# Patient Record
Sex: Female | Born: 2000 | Race: Black or African American | Hispanic: No | Marital: Single | Smoking: Never smoker
Health system: Southern US, Community
[De-identification: ages and names within clinical notes are randomized; demographics above are authoritative.]

---
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
## 2019-02-28 IMAGING — US US ABDOMEN LIMITED
1 series · 14 of 25 positions shown · non-contrast
Comparison: None.

CLINICAL DATA: Right upper quadrant pain

EXAM:
ULTRASOUND ABDOMEN LIMITED RIGHT UPPER QUADRANT

[Series 1: us abdomen limited · 0.16mm/px · 14 of 53 slices shown]
[im 1/53]
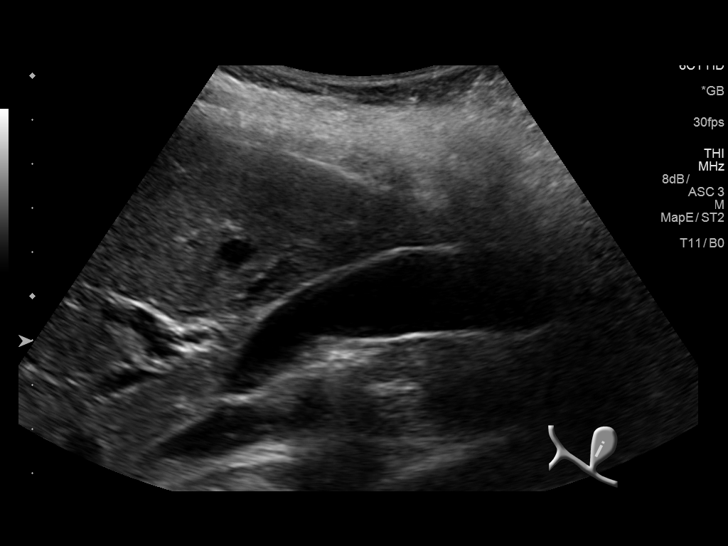
[im 5/53]
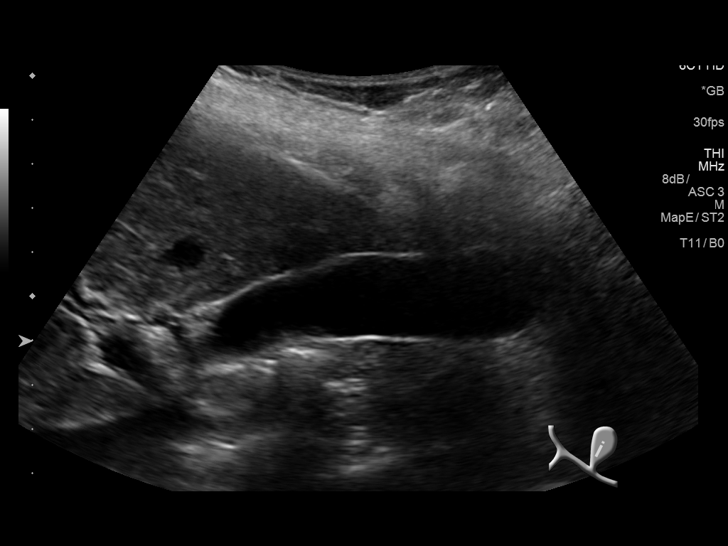
[im 9/53]
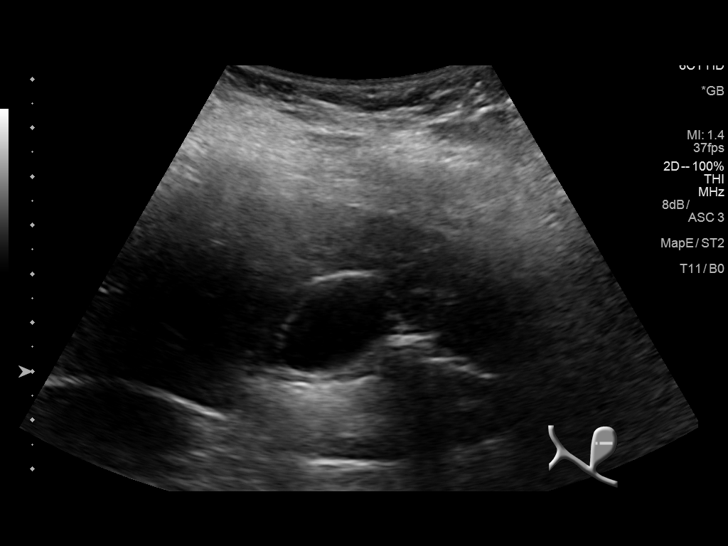
[im 14/53]
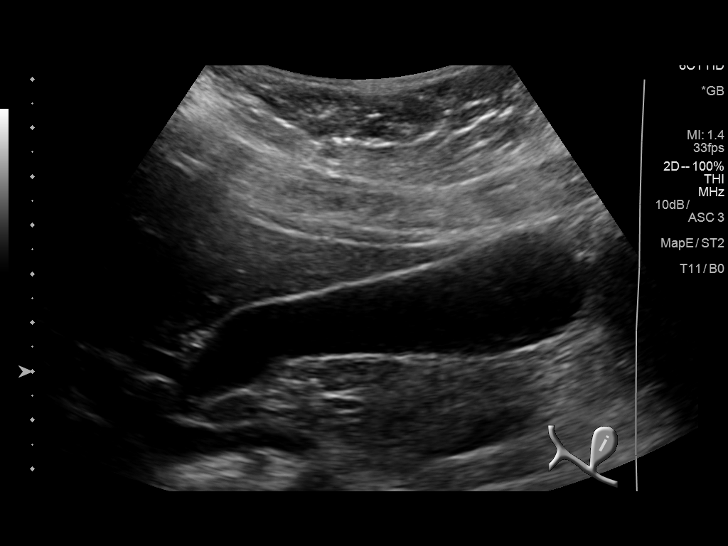
[im 18/53]
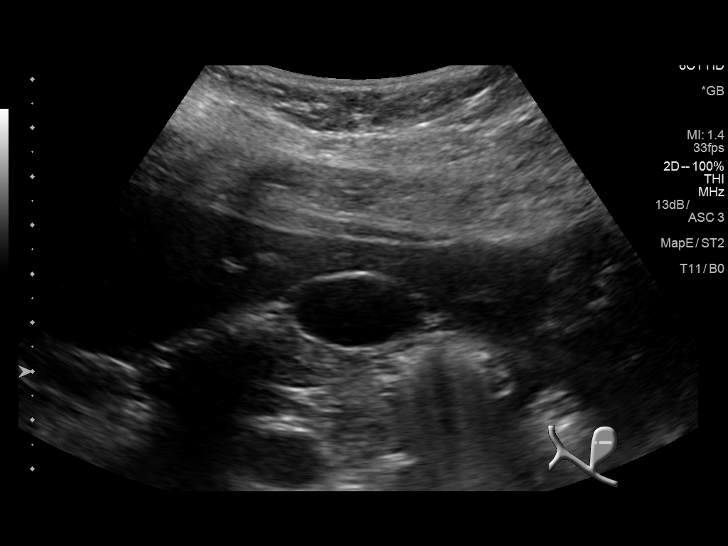
[im 20/53]
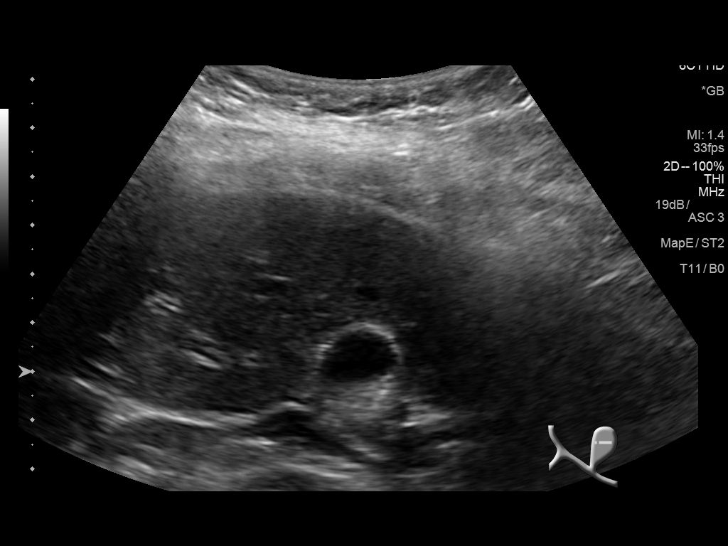
[im 24/53]
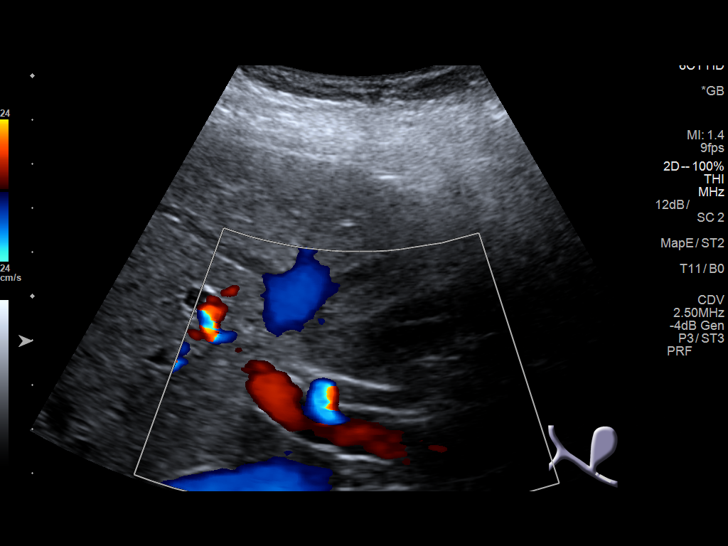
[im 29/53]
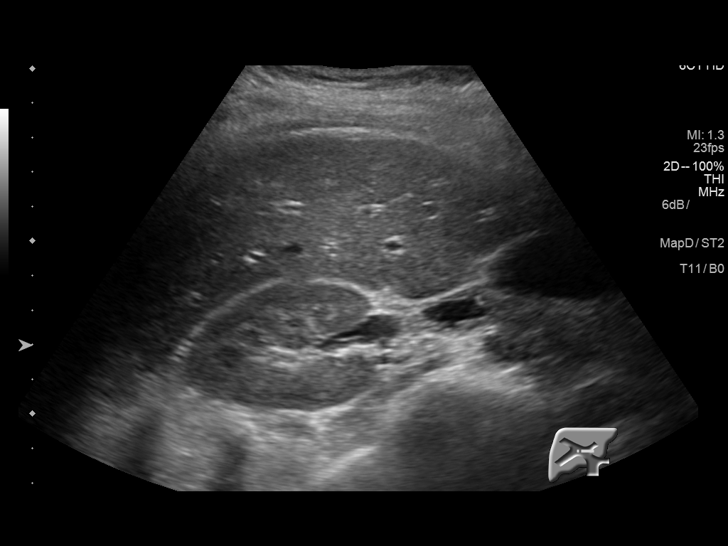
[im 33/53]
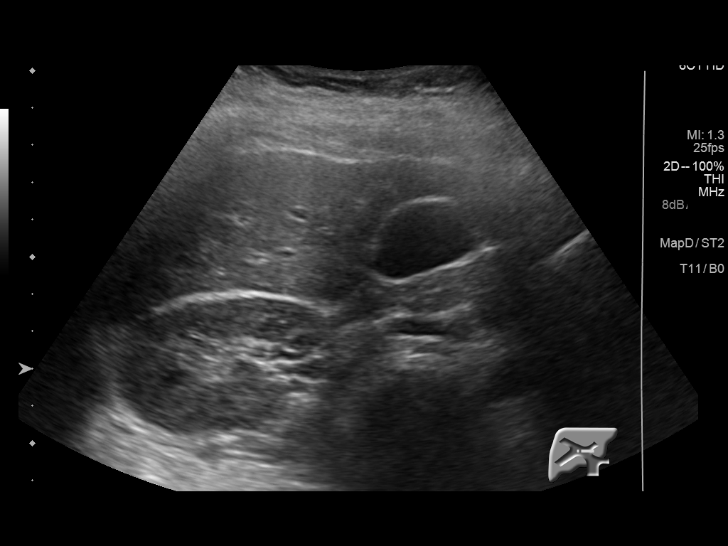
[im 35/53]
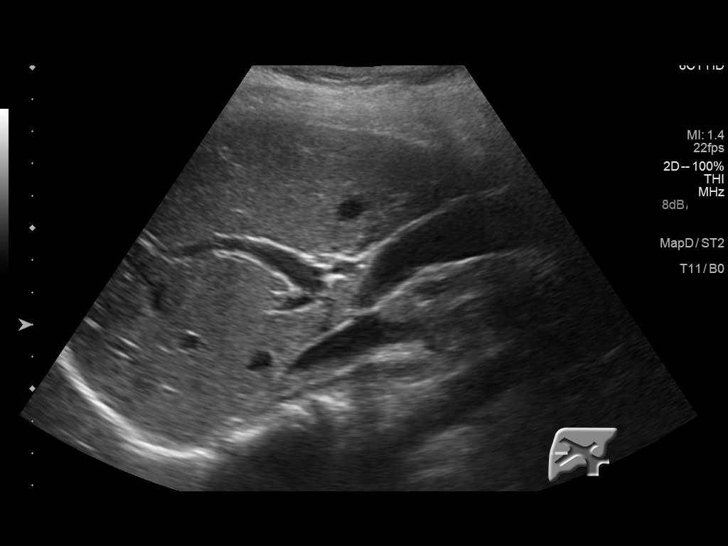
[im 40/53]
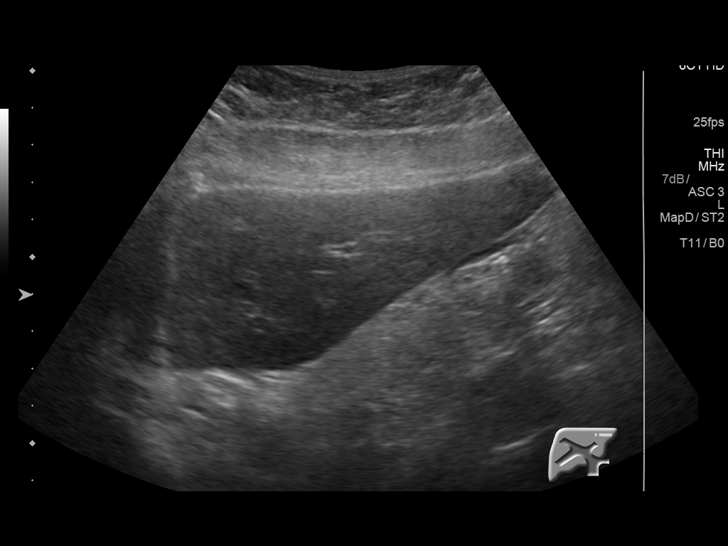
[im 44/53]
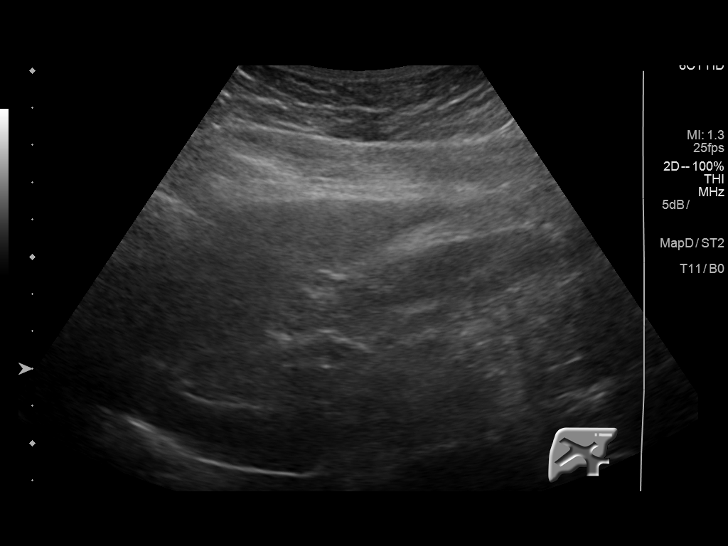
[im 48/53]
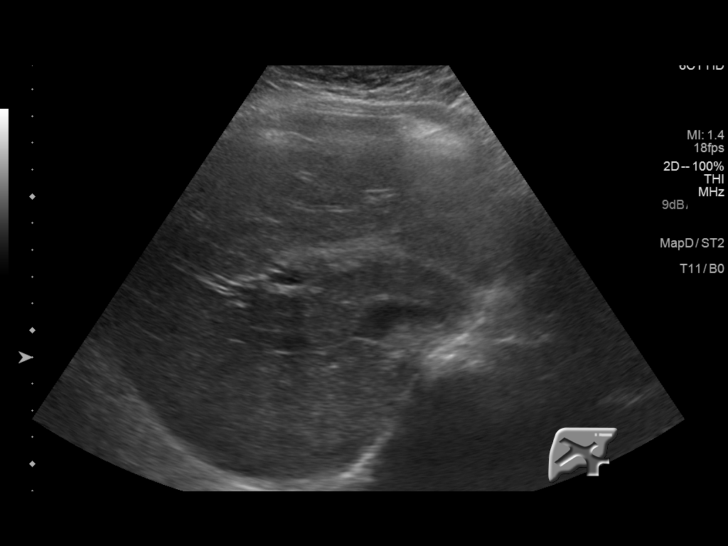
[im 53/53]
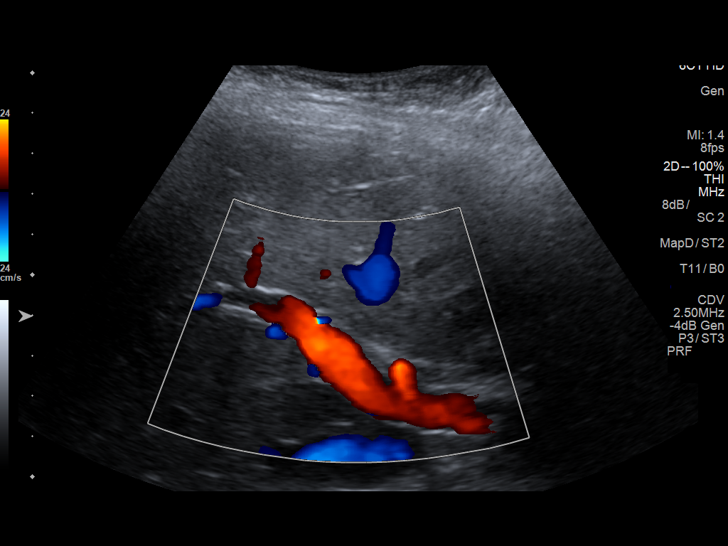

[14 of 25 positions shown; findings below may reference images not displayed]

FINDINGS: Gallbladder:

No gallstones or wall thickening visualized. There is no
pericholecystic fluid. No sonographic Murphy sign noted by
sonographer.

Common bile duct:

Diameter: 4 mm. No intrahepatic or extrahepatic biliary duct
dilatation.

Liver:

No focal lesion identified. Within normal limits in parenchymal
echogenicity. Portal vein is patent on color Doppler imaging with
normal direction of blood flow towards the liver.
IMPRESSION: Study within normal limits.

## 2019-03-17 IMAGING — CT CT ABD-PELV W/ CM
3 of 9 series · 11 of 46 positions shown, 17 images · IV contrast (Isovue)
Comparison: None.

CLINICAL DATA: Right upper quadrant pain, abdominal, back, leg, and
right-sided rib cage pain starting a week ago. Elevated D-dimer.

EXAM:
CT ANGIOGRAPHY CHEST
CT ABDOMEN AND PELVIS WITH CONTRAST
TECHNIQUE: Multidetector CT imaging of the chest was performed using the
standard protocol during bolus administration of intravenous
contrast. Multiplanar CT image reconstructions and MIPs were
obtained to evaluate the vascular anatomy. Multidetector CT imaging
of the abdomen and pelvis was performed using the standard protocol
during bolus administration of intravenous contrast.
CONTRAST:  100mL OGNL5F-Y3E IOPAMIDOL (OGNL5F-Y3E) INJECTION 76%

[Series 5: thins · axial · 0.61mm/px · z∈[-232,-147]mm · 4 of 256 slices shown]
[im 29/256  soft-tissue]
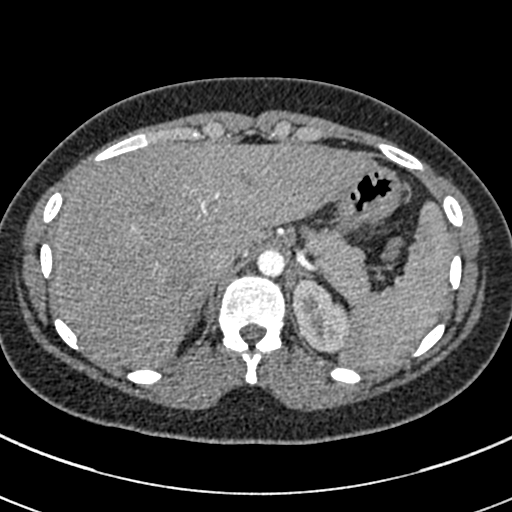
[im 57/256  soft-tissue]
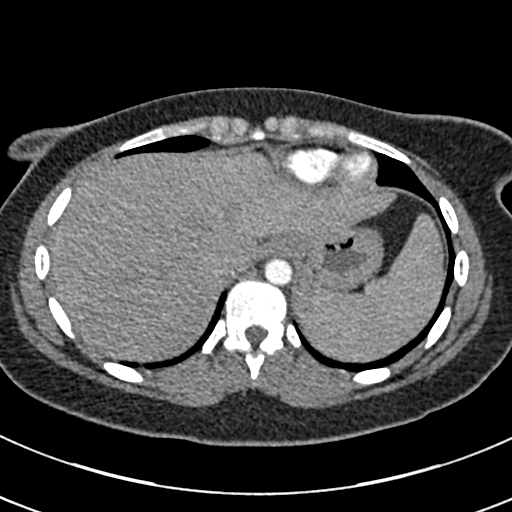
[im 86/256  soft-tissue]
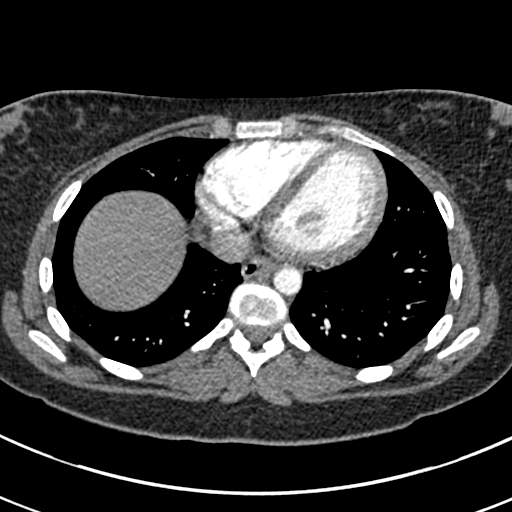
[im 114/256  soft-tissue]
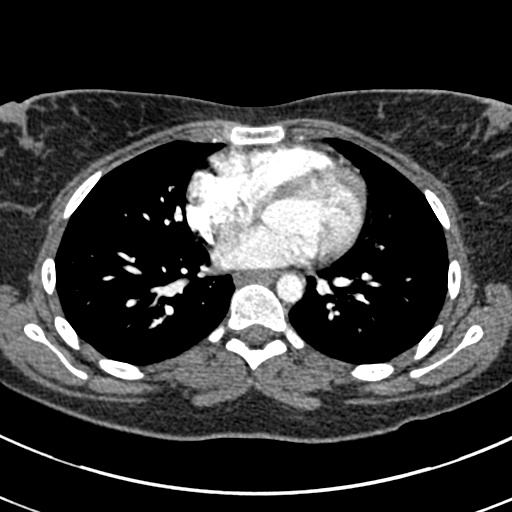

[Series 8: coronal mpr · coronal · 0.55mm/px · 2 of 115 slices shown, 3 images]
[im 39/115  soft-tissue]
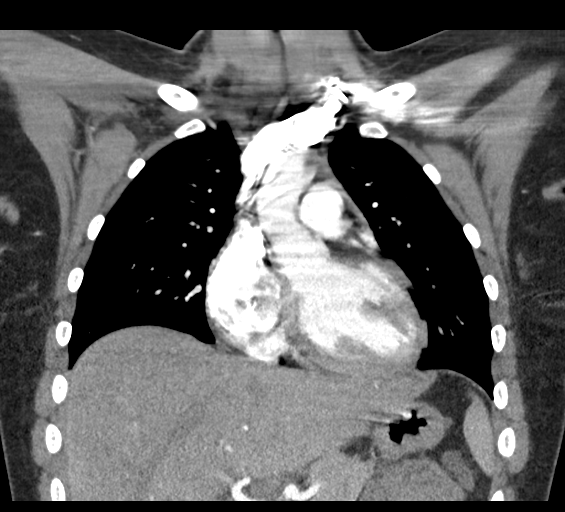
[im 39/115  bone]
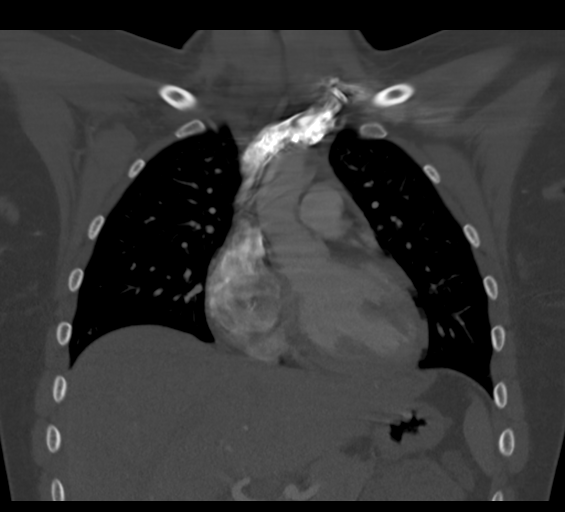
[im 77/115  soft-tissue]
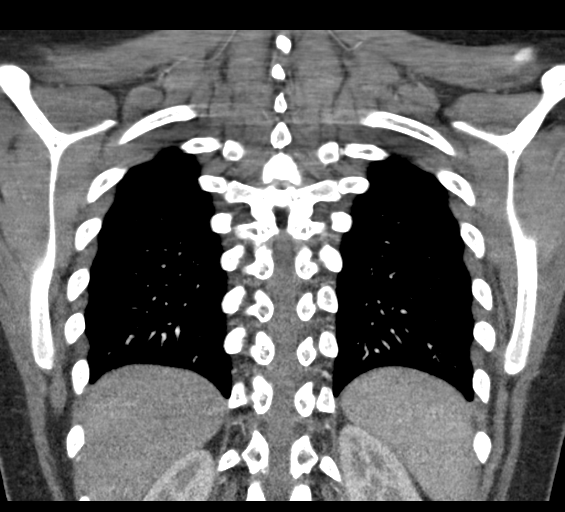

[Series 12: axial st · axial · 0.69mm/px · z∈[-547,-232]mm · 5 of 95 slices shown, 10 images]
[im 16/95  soft-tissue]
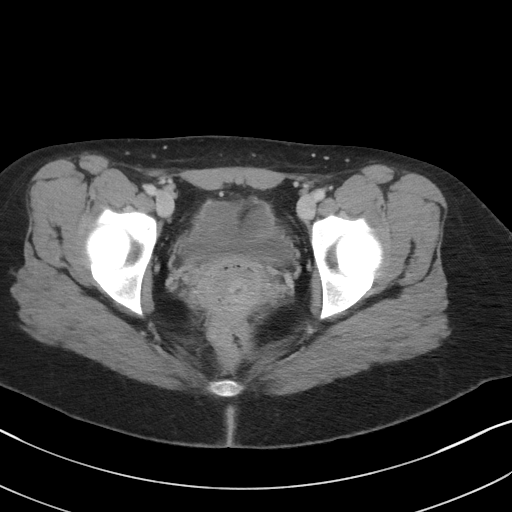
[im 16/95  bone]
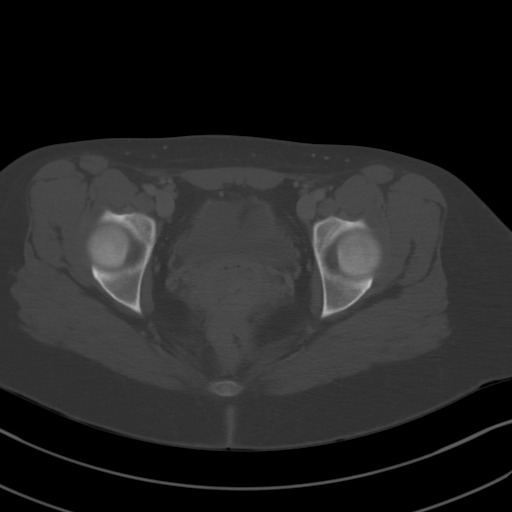
[im 32/95  soft-tissue]
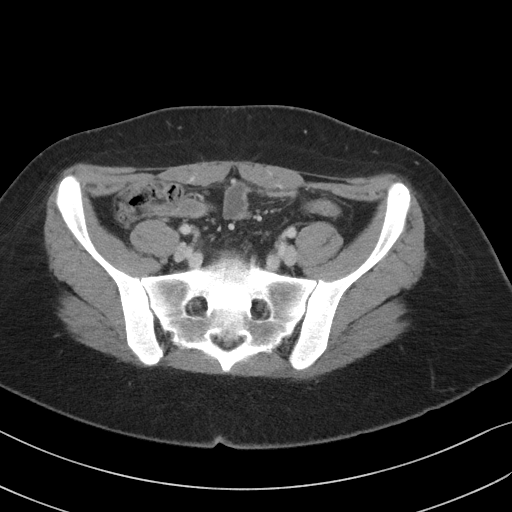
[im 32/95  lung]
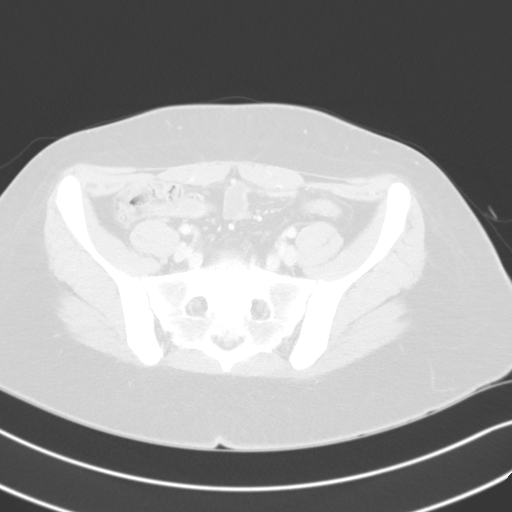
[im 48/95  soft-tissue]
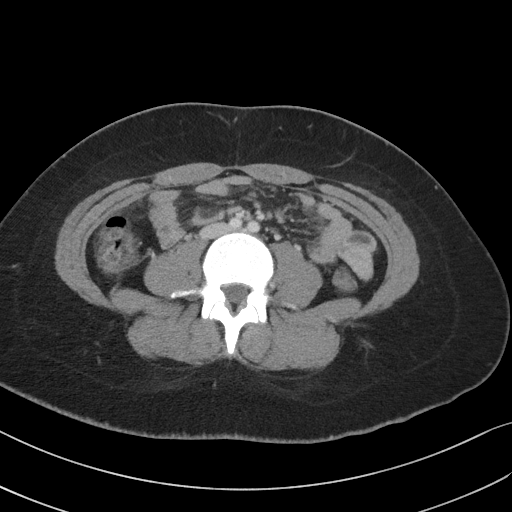
[im 48/95  lung]
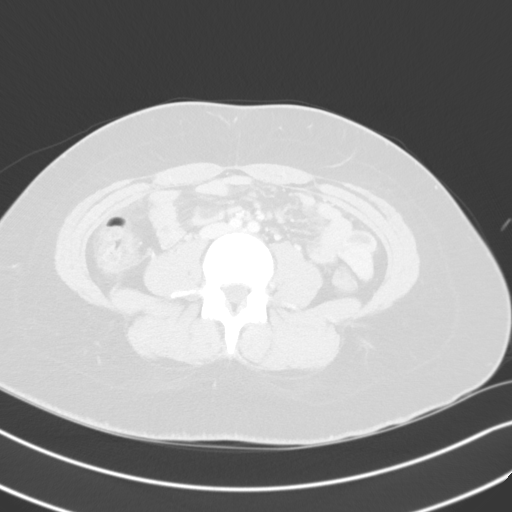
[im 63/95  soft-tissue]
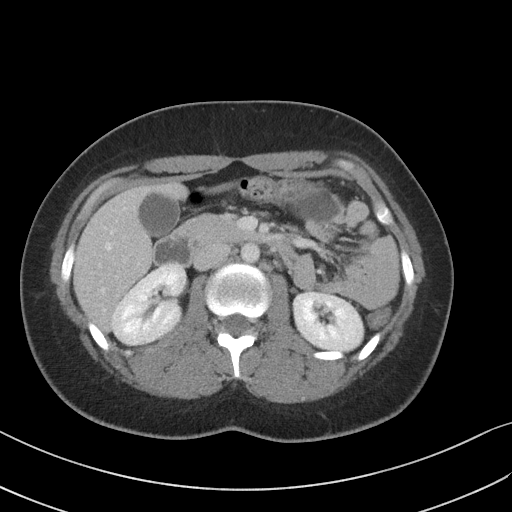
[im 63/95  lung]
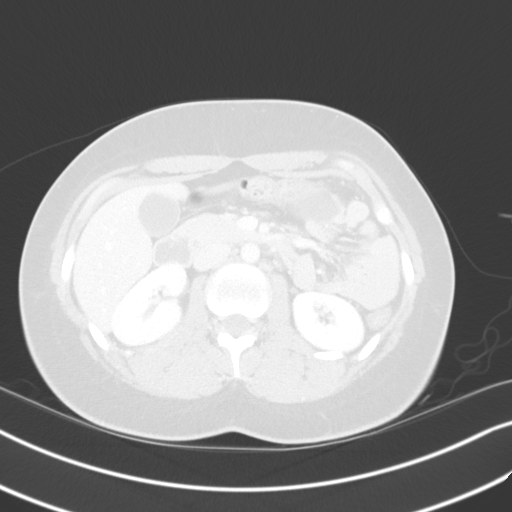
[im 79/95  soft-tissue]
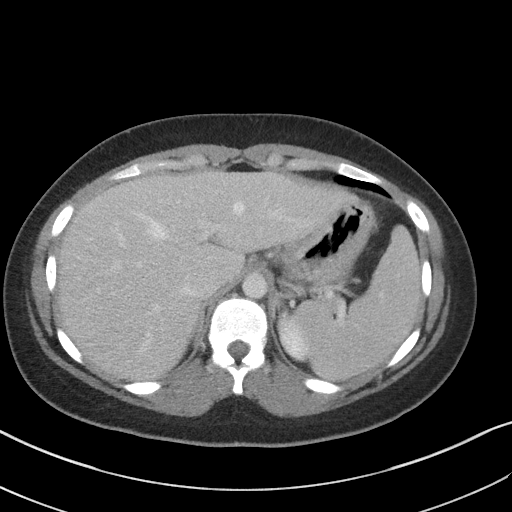
[im 79/95  lung]
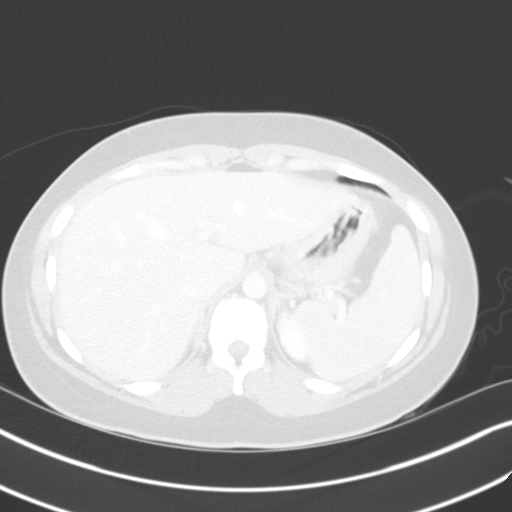

[11 of 46 positions shown; findings below may reference images not displayed]

FINDINGS: CTA CHEST FINDINGS

Cardiovascular: Examination is technically limited due to motion
artifact. There is good opacification of the central and segmental
pulmonary arteries. No focal filling defects. No evidence of
significant pulmonary embolus. Normal heart size. No pericardial
effusion. Normal caliber thoracic aorta. No evidence of aortic
dissection. Great vessel origins are patent.

Mediastinum/Nodes: Esophagus is decompressed. No significant
lymphadenopathy in the chest.

Lungs/Pleura: Evaluation is limited due to motion artifact. Lungs
appear clear and expanded. No focal consolidation. No pleural
effusions. No pneumothorax. Airways are patent.

Musculoskeletal: No chest wall abnormality. No acute or significant
osseous findings.

Review of the MIP images confirms the above findings.

CT ABDOMEN and PELVIS FINDINGS

Hepatobiliary: No focal liver abnormality is seen. No gallstones,
gallbladder wall thickening, or biliary dilatation.

Pancreas: Unremarkable. No pancreatic ductal dilatation or
surrounding inflammatory changes.

Spleen: Normal in size without focal abnormality.

Adrenals/Urinary Tract: No adrenal gland nodules. Renal nephrograms
are symmetrical. No hydronephrosis or hydroureter. Small cyst on the
left kidney. Bladder is unremarkable.

Stomach/Bowel: Stomach, small bowel, and colon are not abnormally
distended. No wall thickening or inflammatory changes appreciated
although under distention limits evaluation of the bowel wall.
Appendix is normal.

Vascular/Lymphatic: No significant vascular findings are present. No
enlarged abdominal or pelvic lymph nodes.

Reproductive: Uterus and ovaries are not significantly enlarged. 2
cysts demonstrated on the left ovary, each measuring about 1.7 cm
diameter. These are likely functional.

Other: No free air or free fluid in the abdomen. Abdominal wall
musculature appears intact. Surgical clips at the umbilicus.

Musculoskeletal: No acute or significant osseous findings.

Review of the MIP images confirms the above findings.
IMPRESSION: 1. No evidence of significant pulmonary embolus. No evidence of
active pulmonary disease.
2. No acute process demonstrated in the abdomen or pelvis. No
evidence of bowel obstruction or inflammation. Appendix is normal.

## 2020-05-23 ENCOUNTER — Emergency Department (HOSPITAL_COMMUNITY)
Admission: EM | Admit: 2020-05-23 | Discharge: 2020-05-23 | Disposition: A | Discharge status: Home / Self Care | Payer: Managed Care, Other (non HMO) | Attending: Emergency Medicine | Admitting: Emergency Medicine

## 2020-05-23 ENCOUNTER — Encounter (HOSPITAL_COMMUNITY): Payer: Self-pay | Admitting: Emergency Medicine

## 2020-05-23 ENCOUNTER — Other Ambulatory Visit: Payer: Self-pay

## 2020-05-23 DIAGNOSIS — Z20822 Contact with and (suspected) exposure to covid-19: Secondary | ICD-10-CM | POA: Diagnosis not present

## 2020-05-23 DIAGNOSIS — J069 Acute upper respiratory infection, unspecified: Secondary | ICD-10-CM | POA: Insufficient documentation

## 2020-05-23 DIAGNOSIS — J029 Acute pharyngitis, unspecified: Secondary | ICD-10-CM | POA: Diagnosis present

## 2020-05-23 LAB — SARS CORONAVIRUS 2 BY RT PCR (HOSPITAL ORDER, PERFORMED IN ~~LOC~~ HOSPITAL LAB): SARS Coronavirus 2: NEGATIVE

## 2020-05-23 LAB — GROUP A STREP BY PCR: Group A Strep by PCR: NOT DETECTED

## 2020-05-23 MED ORDER — ACETAMINOPHEN 325 MG PO TABS
650.0000 mg | ORAL_TABLET | Freq: Once | ORAL | Status: AC
  Administered 2020-05-23: 650 mg via ORAL
  Filled 2020-05-23: qty 2

## 2020-05-23 NOTE — ED Provider Notes (Signed)
[facility] EMERGENCY DEPARTMENT Provider Note   CSN: [id number] Arrival date & time: 05/23/20  1142     History Chief Complaint  Patient presents with   Sore Throat    Sonia Archer is a 19 y.o. female.  19 year old female presents with complaint of chills with sweats, assume she has a fever but has not checked her temperature as well as a sore throat and congestion with body aches. Patient states her symptoms started last night. Patient has not had her COVID-19 vaccine series. No known sick contacts. Has not taken anything for her symptoms. No other complaints or concerns.        History reviewed. No pertinent past medical history.  There are no problems to display for this patient.   History reviewed. No pertinent surgical history.   OB History   No obstetric history on file.     No family history on file.  Social History   Tobacco Use   Smoking status: Never Smoker   Smokeless tobacco: Never Used  Vaping Use   Vaping Use: Never used  Substance Use Topics   Alcohol use: Not Currently   Drug use: Never    Home Medications Prior to Admission medications   Not on File    Allergies    Penicillins  Review of Systems   Review of Systems  Constitutional: Positive for chills and diaphoresis.  HENT: Positive for congestion and sore throat.   Respiratory: Negative for cough and shortness of breath.   Gastrointestinal: Negative for diarrhea.  Musculoskeletal: Positive for arthralgias and myalgias.  Skin: Negative for rash and wound.  Allergic/Immunologic: Negative for immunocompromised state.  Neurological: Negative for weakness.  Hematological: Negative for adenopathy.  Psychiatric/Behavioral: Negative for confusion.  All other systems reviewed and are negative.   Physical Exam Updated Vital Signs BP (!) 151/80 (BP Location: Right Arm)    Pulse (!) 106    Temp 99.6 F (37.6 C) (Oral)    Resp 18    Ht 5\' 7"  (1.702 m)    Wt 80.7 kg    LMP  05/20/2020 (Exact Date)    SpO2 100%    BMI 27.88 kg/m   Physical Exam Vitals and nursing note reviewed.  Constitutional:      General: She is not in acute distress.    Appearance: She is well-developed. She is not diaphoretic.  HENT:     Head: Normocephalic and atraumatic.     Nose: No congestion or rhinorrhea.     Mouth/Throat:     Mouth: No oral lesions.     Pharynx: Oropharynx is clear. Uvula midline. Posterior oropharyngeal erythema present. No pharyngeal swelling, oropharyngeal exudate or uvula swelling.     Tonsils: No tonsillar exudate. 1+ on the right. 1+ on the left.  Cardiovascular:     Rate and Rhythm: Normal rate and regular rhythm.     Heart sounds: Normal heart sounds.  Pulmonary:     Effort: Pulmonary effort is normal.  Skin:    General: Skin is warm and dry.     Findings: No rash.  Neurological:     Mental Status: She is alert and oriented to person, place, and time.  Psychiatric:        Behavior: Behavior normal.     ED Results / Procedures / Treatments   Labs (all labs ordered are listed, but only abnormal results are displayed) Labs Reviewed  GROUP A STREP BY PCR  SARS CORONAVIRUS 2 BY RT PCR (HOSPITAL ORDER, PERFORMED  IN [facility])    EKG None  Radiology No results found.  Procedures Procedures (including critical care time)  Medications Ordered in ED Medications  acetaminophen (TYLENOL) tablet 650 mg (has no administration in time range)    ED Course  I have reviewed the triage vital signs and the nursing notes.  Pertinent labs & imaging results that were available during my care of the patient were reviewed by me and considered in my medical decision making (see chart for details).  Clinical Course as of May 23 1316  Thu May 23, 2020  1316 Sonia Archer was evaluated in Emergency Department on 05/23/2020 for the symptoms described in the history of present illness. She was evaluated in the context of the global COVID-19  pandemic, which necessitated consideration that the patient might be at risk for infection with the SARS-CoV-2 virus that causes COVID-19. Institutional protocols and algorithms that pertain to the evaluation of patients at risk for COVID-19 are in a state of rapid change based on information released by regulatory bodies including the CDC and federal and state organizations. These policies and algorithms were followed during the patient's care in the ED.     [LM]  1274 19 year old female with URI symptoms since last night. Patient's temperature is 99.6 on arrival, she feels warm to the touch. Strep test is negative, suspect Covid infection, will swab for Covid, advised to quarantine accordingly. Will give Tylenol for her elevated temperature today. Medication obtain a thermometer to monitor her temperature, take Tylenol as needed as directed and symptomatic relief as needed.   [LM]  1317 Patient was advised to contact the department for her Covid results will follow for results in her MyChart.   [LM]    Clinical Course User Index [LM] Alden Hipp   MDM Rules/Calculators/A&P                          Final Clinical Impression(s) / ED Diagnoses Final diagnoses:  Viral upper respiratory tract infection    Rx / DC Orders ED Discharge Orders    None       Jeannie Fend, PA-C 05/23/20 1317    Derwood Kaplan, MD 05/24/20 1145

## 2020-05-23 NOTE — Discharge Instructions (Addendum)
Symptoms today are concerning for COVID infection. Recommend obtaining a thermometer to monitor your temperature. Take Tylenol as needed as directed for fevers (temperature of 100.4 or higher).  Quarantine at home pending your results. If your test is negative, continue quarantine until you are fever free for 72 hours without taking Tylenol or other fever reducer. IF your COVID test is positive, quarantine for 10 days.

## 2020-05-23 NOTE — ED Triage Notes (Signed)
Pt c/o body aches, sore throat, & HA that began last night. Denies COVID exposure.

## 2020-05-24 ENCOUNTER — Encounter (HOSPITAL_COMMUNITY): Payer: Self-pay | Admitting: *Deleted

## 2024-05-23 ENCOUNTER — Ambulatory Visit: Payer: Self-pay
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
